# Patient Record
Sex: Female | Born: 1995 | Race: Black or African American | Hispanic: No | Marital: Single | Smoking: Never smoker
Health system: Southern US, Community
[De-identification: ages and names within clinical notes are randomized; demographics above are authoritative.]

## PROBLEM LIST (undated history)

## (undated) DIAGNOSIS — R011 Cardiac murmur, unspecified: Secondary | ICD-10-CM

## (undated) DIAGNOSIS — Q893 Situs inversus: Secondary | ICD-10-CM

## (undated) DIAGNOSIS — M419 Scoliosis, unspecified: Secondary | ICD-10-CM

## (undated) HISTORY — PX: SPINAL FUSION: SHX223

---
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
## 2015-10-06 ENCOUNTER — Encounter (HOSPITAL_COMMUNITY): Payer: Self-pay | Admitting: Emergency Medicine

## 2015-10-06 ENCOUNTER — Emergency Department (HOSPITAL_COMMUNITY): Payer: BLUE CROSS/BLUE SHIELD

## 2015-10-06 ENCOUNTER — Emergency Department (HOSPITAL_COMMUNITY)
Admission: EM | Admit: 2015-10-06 | Discharge: 2015-10-07 | Disposition: A | Discharge status: Home / Self Care | Payer: BLUE CROSS/BLUE SHIELD | Attending: Emergency Medicine | Admitting: Emergency Medicine

## 2015-10-06 DIAGNOSIS — Q893 Situs inversus: Secondary | ICD-10-CM | POA: Insufficient documentation

## 2015-10-06 DIAGNOSIS — R079 Chest pain, unspecified: Secondary | ICD-10-CM | POA: Diagnosis present

## 2015-10-06 DIAGNOSIS — Z8739 Personal history of other diseases of the musculoskeletal system and connective tissue: Secondary | ICD-10-CM | POA: Insufficient documentation

## 2015-10-06 DIAGNOSIS — R0602 Shortness of breath: Secondary | ICD-10-CM | POA: Diagnosis not present

## 2015-10-06 DIAGNOSIS — R0789 Other chest pain: Secondary | ICD-10-CM | POA: Insufficient documentation

## 2015-10-06 DIAGNOSIS — R011 Cardiac murmur, unspecified: Secondary | ICD-10-CM | POA: Diagnosis not present

## 2015-10-06 LAB — CBC
HCT: 37.4 % (ref 36.0–46.0)
HEMOGLOBIN: 12.3 g/dL (ref 12.0–15.0)
MCH: 28.4 pg (ref 26.0–34.0)
MCHC: 32.9 g/dL (ref 30.0–36.0)
MCV: 86.4 fL (ref 78.0–100.0)
Platelets: 194 10*3/uL (ref 150–400)
RBC: 4.33 MIL/uL (ref 3.87–5.11)
RDW: 14.2 % (ref 11.5–15.5)
WBC: 5.9 10*3/uL (ref 4.0–10.5)

## 2015-10-06 MED ORDER — KETOROLAC TROMETHAMINE 30 MG/ML IJ SOLN
30.0000 mg | Freq: Once | INTRAMUSCULAR | Status: AC
  Administered 2015-10-07: 30 mg via INTRAVENOUS
  Filled 2015-10-06: qty 1

## 2015-10-06 MED ORDER — HYDROCODONE-ACETAMINOPHEN 5-325 MG PO TABS
1.0000 | ORAL_TABLET | Freq: Once | ORAL | Status: AC
  Administered 2015-10-07: 1 via ORAL
  Filled 2015-10-06: qty 1

## 2015-10-06 NOTE — ED Notes (Signed)
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

## 2015-10-06 NOTE — ED Notes (Signed)
Patient transported to X-ray 

## 2015-10-06 NOTE — ED Provider Notes (Signed)
CSN: [id number]     Arrival date & time 10/06/15  2315 History  By signing my name below, I, Rohini Rajnarayanan, attest that this documentation has been prepared under the direction and in the presence of Shon Baton, MD Electronically Signed: Charlean Merl, ED Scribe 10/07/2015 at 12:12 AM.      Chief Complaint  Patient presents with  . Chest Pain  . Shortness of Breath   The history is provided by the patient. No language interpreter was used.  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 The pain began to worsen later in the day with associated arm pain and slight SOB. Pt admits that she had experienced similar sx once before, but they subsided and had not returned until today. She reports that she may have had a sinus infection recently. Pt admits that she uses estrogen. PT denies any blood clots, hx of PE/DVT, or long travel. Her LNMP began around 1/29.   Past Medical History  Diagnosis Date  . Heart murmur   . Situs inversus   . Scoliosis    Past Surgical History  Procedure Laterality Date  . Spinal fusion     No family history on file. Social History  Substance Use Topics  . Smoking status: Never Smoker   . Smokeless tobacco: Never Used  . Alcohol Use: Yes   OB History    No data available     Review of Systems  Constitutional: Negative for fever.  Respiratory: Positive for shortness of breath. Negative for cough.   Cardiovascular: Positive for chest pain.  Gastrointestinal: Negative for nausea, vomiting and abdominal pain.  All other systems reviewed and are negative.   Allergies  Bactrim  Home Medications   Prior to Admission medications   Medication Sig Start Date End Date Taking? Authorizing Provider  Fexofenadine HCl (MUCINEX ALLERGY PO) Take 30 mLs by mouth every 4 (four) hours as needed (congestion).   Yes  Historical Provider, MD  Levonorgestrel (SKYLA) 13.5 MG IUD 1 application by Intrauterine route once.   Yes Historical Provider, MD  phenylephrine (SUDAFED PE) 10 MG TABS tablet Take 10 mg by mouth every 4 (four) hours as needed (congestion).   Yes Historical Provider, MD  naproxen (NAPROSYN) 500 MG tablet Take 1 tablet (500 mg total) by mouth 2 (two) times daily. 10/07/15   Shon Baton, MD   BP 123/97 mmHg  Pulse 72  Temp(Src) 98.9 F (37.2 C) (Oral)  Resp 22  Ht  (1.676 m)  Wt 140 lb (63.504 kg)  BMI 22.61 kg/m2  SpO2 100%  LMP 09/29/2015 Physical Exam  Constitutional: She is oriented to person, place, and time. She appears well-developed and well-nourished. No distress.  HENT:  Head: Normocephalic and atraumatic.  Cardiovascular: Normal rate, regular rhythm and normal heart sounds.   Pulmonary/Chest: Effort normal. No respiratory distress. She has no wheezes. She exhibits tenderness.  Abdominal: Soft. Bowel sounds are normal. There is no tenderness. There is no rebound.  Musculoskeletal: She exhibits no edema.  Neurological: She is alert and oriented to person, place, and time.  Skin: Skin is warm and dry.  Psychiatric: She has a normal mood and affect.  Nursing note and vitals reviewed.   ED Course  Procedures  DIAGNOSTIC STUDIES: Oxygen Saturation is 100% on RA, normal by my interpretation.    COORDINATION OF CARE: 11:25  PM-Discussed treatment plan which includes bloodwork, DG chest, EKG, cardiac monitoring, and pain meds,  with pt at bedside and pt agreed to plan.   Labs Review Labs Reviewed  BASIC METABOLIC PANEL  CBC  D-DIMER, QUANTITATIVE (NOT AT [facility])  Rosezena Sensor, ED    Imaging Review Dg Chest 2 View  10/07/2015  CLINICAL DATA:  20 year old female with chest pain EXAM: CHEST  2 VIEW COMPARISON:  None. FINDINGS: Two views of the chest do not demonstrate a focal consolidation. There is no pleural effusion or pneumothorax. There is dextro cardia.  Thoracic spinal fixation Harrington rods noted. An air-fluid structure in the right upper abdomen may represent the stomach and indicate status inversus. IMPRESSION: No active cardiopulmonary disease. Electronically Signed   By: Elgie Collard M.D.   On: 10/07/2015 00:06   I have personally reviewed and evaluated these images and lab results as part of my medical decision-making.   EKG Interpretation   Date/Time:  Monday October 06 2015 23:27:45 EST Ventricular Rate:  79 PR Interval:  160 QRS Duration: 96 QT Interval:  384 QTC Calculation: 440 R Axis:   90 Text Interpretation:  Sinus rhythm Borderline right axis deviation  Confirmed by HORTON  MD, COURTNEY ([id number]) on 10/07/2015 12:30:02 AM      MDM   Final diagnoses:  Musculoskeletal chest pain   Patient presents with chest pain. Fairly acute in nature.  Nontoxic-appearing. Vital signs are reassuring. Chest pain is reproducible on exam. 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 Right-sided EKG obtained and reassuring. She is low risk for PE but is on birth control. D-dimer is negative. Troponin negative. Chest x-ray without evidence of pneumothorax or pneumonia. Given reproducibility of pain, suspect chest wall pain or musculoskeletal pain. Discussed with patient a trial of NSAIDs for 3-4 days. Follow-up if pain persists.  After history, exam, and medical workup I feel the patient has been appropriately medically screened and is safe for discharge home. Pertinent diagnoses were discussed with the patient. Patient was given return precautions.   I personally performed the services described in this documentation, which was scribed in my presence. The recorded information has been reviewed and is accurate.    Shon Baton, MD 10/07/15 [phone]

## 2015-10-07 LAB — BASIC METABOLIC PANEL
Anion gap: 11 (ref 5–15)
BUN: 19 mg/dL (ref 6–20)
CHLORIDE: 107 mmol/L (ref 101–111)
CO2: 22 mmol/L (ref 22–32)
CREATININE: 0.77 mg/dL (ref 0.44–1.00)
Calcium: 9.3 mg/dL (ref 8.9–10.3)
GFR calc Af Amer: >60 mL/min (ref >60)
GFR calc non Af Amer: >60 mL/min (ref >60)
GLUCOSE: 88 mg/dL (ref 65–99)
POTASSIUM: 3.7 mmol/L (ref 3.5–5.1)
Sodium: 140 mmol/L (ref 135–145)

## 2015-10-07 LAB — I-STAT TROPONIN, ED: Troponin i, poc: 0 ng/mL (ref 0.00–0.08)

## 2015-10-07 LAB — D-DIMER, QUANTITATIVE: D-Dimer, Quant: 0.47 ug/mL-FEU (ref 0.00–0.50)

## 2015-10-07 MED ORDER — NAPROXEN 500 MG PO TABS: 500.0000 mg | ORAL_TABLET | Freq: Two times a day (BID) | ORAL | Status: AC

## 2015-10-07 NOTE — ED Notes (Signed)
MD at bedside. 

## 2015-10-07 NOTE — Discharge Instructions (Signed)

## 2016-02-09 ENCOUNTER — Ambulatory Visit: Payer: BLUE CROSS/BLUE SHIELD

## 2016-02-10 ENCOUNTER — Encounter (HOSPITAL_COMMUNITY): Payer: Self-pay | Admitting: Nurse Practitioner

## 2016-02-10 ENCOUNTER — Emergency Department (HOSPITAL_COMMUNITY)
Admission: EM | Admit: 2016-02-10 | Discharge: 2016-02-10 | Disposition: A | Discharge status: Home / Self Care | Payer: BLUE CROSS/BLUE SHIELD | Attending: Emergency Medicine | Admitting: Emergency Medicine

## 2016-02-10 ENCOUNTER — Emergency Department (HOSPITAL_COMMUNITY): Payer: BLUE CROSS/BLUE SHIELD

## 2016-02-10 DIAGNOSIS — J029 Acute pharyngitis, unspecified: Secondary | ICD-10-CM | POA: Diagnosis not present

## 2016-02-10 DIAGNOSIS — K529 Noninfective gastroenteritis and colitis, unspecified: Secondary | ICD-10-CM | POA: Diagnosis not present

## 2016-02-10 DIAGNOSIS — R112 Nausea with vomiting, unspecified: Secondary | ICD-10-CM | POA: Diagnosis present

## 2016-02-10 LAB — I-STAT BETA HCG BLOOD, ED (MC, WL, AP ONLY): I-stat hCG, quantitative: 6.6 m[IU]/mL — ABNORMAL HIGH (ref <5)

## 2016-02-10 LAB — COMPREHENSIVE METABOLIC PANEL
ALK PHOS: 51 U/L (ref 38–126)
ALT: 16 U/L (ref 14–54)
AST: 25 U/L (ref 15–41)
Albumin: 3.9 g/dL (ref 3.5–5.0)
Anion gap: 5 (ref 5–15)
BILIRUBIN TOTAL: 0.6 mg/dL (ref 0.3–1.2)
BUN: 13 mg/dL (ref 6–20)
CO2: 24 mmol/L (ref 22–32)
Calcium: 9.3 mg/dL (ref 8.9–10.3)
Chloride: 107 mmol/L (ref 101–111)
Creatinine, Ser: 0.86 mg/dL (ref 0.44–1.00)
GFR calc Af Amer: >60 mL/min (ref >60)
GFR calc non Af Amer: >60 mL/min (ref >60)
GLUCOSE: 104 mg/dL — AB (ref 65–99)
Potassium: 4 mmol/L (ref 3.5–5.1)
Sodium: 136 mmol/L (ref 135–145)
TOTAL PROTEIN: 7.2 g/dL (ref 6.5–8.1)

## 2016-02-10 LAB — URINALYSIS, ROUTINE W REFLEX MICROSCOPIC
GLUCOSE, UA: NEGATIVE mg/dL
Ketones, ur: 40 mg/dL — AB
Leukocytes, UA: NEGATIVE
Nitrite: NEGATIVE
PH: 6 (ref 5.0–8.0)
Protein, ur: 30 mg/dL — AB
SPECIFIC GRAVITY, URINE: 1.039 — AB (ref 1.005–1.030)

## 2016-02-10 LAB — CBC
HEMATOCRIT: 36.1 % (ref 36.0–46.0)
HEMOGLOBIN: 11.8 g/dL — AB (ref 12.0–15.0)
MCH: 28.6 pg (ref 26.0–34.0)
MCHC: 32.7 g/dL (ref 30.0–36.0)
MCV: 87.6 fL (ref 78.0–100.0)
Platelets: 195 10*3/uL (ref 150–400)
RBC: 4.12 MIL/uL (ref 3.87–5.11)
RDW: 13.7 % (ref 11.5–15.5)
WBC: 14.8 10*3/uL — ABNORMAL HIGH (ref 4.0–10.5)

## 2016-02-10 LAB — URINE MICROSCOPIC-ADD ON: WBC, UA: NONE SEEN WBC/hpf (ref 0–5)

## 2016-02-10 MED ORDER — MORPHINE SULFATE (PF) 2 MG/ML IV SOLN
2.0000 mg | Freq: Once | INTRAVENOUS | Status: AC
  Administered 2016-02-10: 2 mg via INTRAVENOUS
  Filled 2016-02-10: qty 1

## 2016-02-10 MED ORDER — IOPAMIDOL (ISOVUE-300) INJECTION 61%
INTRAVENOUS | Status: AC
  Administered 2016-02-10: 75 mL
  Filled 2016-02-10: qty 75

## 2016-02-10 MED ORDER — ONDANSETRON HCL 4 MG PO TABS: 4.0000 mg | ORAL_TABLET | Freq: Four times a day (QID) | ORAL | Status: AC

## 2016-02-10 NOTE — ED Notes (Addendum)
Pt c/o n/v/d since this morning. She was started on amoxicillin yesterday for possible strep throat, culture has not returned yet but was treated based on clinical symptoms of sore, red throat and painful swallowing. She also reports fevers and body aches today. She is alert and breathing easily

## 2016-02-10 NOTE — ED Notes (Signed)
Patient transported to CT 

## 2016-02-10 NOTE — ED Provider Notes (Signed)
CSN: [id number]     Arrival date & time 02/10/16  1207 History   First MD Initiated Contact with Patient 02/10/16 1531     Chief Complaint  Patient presents with  . Emesis   (Consider location/radiation/quality/duration/timing/severity/associated sxs/prior Treatment) HPI 20 y.o. female hx Situs Inversus presents to the Emergency Department today complaining of N/V/D this AM. Noted being Rxed amoxicillin yesterday for possible strep throat. Culture pending. Notes fevers and body aches. Tmax 99.8. Had emesis x 3-4. Diarrhea x 1. Notes pain mostly in throat at 9/10. Notes throbbing/sharp pain with PO intake. Has tried salt water, ibuprofen with minimal relief. Has had hx of sore throats, but never like this. Does endorse anterior neck pain around tonsils with pain on ROM. No spinous process tenderness. No trismus. No CP/SOB. No headache. No visual changes. No other symptoms noted.   Past Medical History  Diagnosis Date  . Heart murmur   . Situs inversus   . Scoliosis    Past Surgical History  Procedure Laterality Date  . Spinal fusion     History reviewed. No pertinent family history. Social History  Substance Use Topics  . Smoking status: Never Smoker   . Smokeless tobacco: Never Used  . Alcohol Use: Yes   OB History    No data available     Review of Systems ROS reviewed and all are negative for acute change except as noted in the HPI.  Allergies  Bactrim  Home Medications   Prior to Admission medications   Medication Sig Start Date End Date Taking? Authorizing Provider  Fexofenadine HCl (MUCINEX ALLERGY PO) Take 30 mLs by mouth every 4 (four) hours as needed (congestion).    Historical Provider, MD  Levonorgestrel (SKYLA) 13.5 MG IUD 1 application by Intrauterine route once.    Historical Provider, MD  naproxen (NAPROSYN) 500 MG tablet Take 1 tablet (500 mg total) by mouth 2 (two) times daily. 10/07/15   Shon Baton, MD  phenylephrine (SUDAFED PE) 10 MG TABS tablet  Take 10 mg by mouth every 4 (four) hours as needed (congestion).    Historical Provider, MD   BP 109/82 mmHg  Pulse 79  Temp(Src) 97.9 F (36.6 C) (Oral)  Resp 17  SpO2 98%  LMP 02/09/2016   Physical Exam  Constitutional: She is oriented to person, place, and time. She appears well-developed and well-nourished.  HENT:  Head: Normocephalic and atraumatic.  Mouth/Throat: Uvula is midline. No trismus in the jaw. No uvula swelling. Oropharyngeal exudate and posterior oropharyngeal erythema present.  No uvular deviation. Noted tonsillitis    Eyes: EOM are normal. Pupils are equal, round, and reactive to light.  Neck: Trachea normal and phonation normal. Neck supple. Decreased range of motion present. No tracheal deviation present. No Brudzinski's sign and no Kernig's sign noted.  No nuchal rigidity   Cardiovascular: Normal rate, regular rhythm, normal heart sounds and intact distal pulses.   No murmur heard. Pulmonary/Chest: Effort normal and breath sounds normal. No respiratory distress. She has no wheezes. She has no rales. She exhibits no tenderness.  Abdominal: Soft. Normal appearance and bowel sounds are normal. There is no tenderness. There is no rigidity, no rebound, no guarding, no tenderness at McBurney's point and negative Murphy's sign.  Neurological: She is alert and oriented to person, place, and time.  Skin: Skin is warm and dry.  Psychiatric: She has a normal mood and affect. Her behavior is normal. Thought content normal.  Nursing note and vitals reviewed.  ED Course  Procedures (including critical care time) Labs Review Labs Reviewed  COMPREHENSIVE METABOLIC PANEL - Abnormal; Notable for the following:    Glucose, Bld 104 (*)    All other components within normal limits  CBC - Abnormal; Notable for the following:    WBC 14.8 (*)    Hemoglobin 11.8 (*)    All other components within normal limits  URINALYSIS, ROUTINE W REFLEX MICROSCOPIC (NOT AT [facility]) - Abnormal;  Notable for the following:    Color, Urine AMBER (*)    APPearance CLOUDY (*)    Specific Gravity, Urine 1.039 (*)    Hgb urine dipstick SMALL (*)    Bilirubin Urine SMALL (*)    Ketones, ur 40 (*)    Protein, ur 30 (*)    All other components within normal limits  URINE MICROSCOPIC-ADD ON - Abnormal; Notable for the following:    Squamous Epithelial / LPF 6-30 (*)    Bacteria, UA FEW (*)    All other components within normal limits  I-STAT BETA HCG BLOOD, ED ([facility], [facility], [facility] ONLY) - Abnormal; Notable for the following:    I-stat hCG, quantitative 6.6 (*)    All other components within normal limits   Imaging Review Ct Soft Tissue Neck W Contrast  02/10/2016  CLINICAL DATA:  Sore throat with nausea, vomiting, diarrhea, fever, and chills. EXAM: CT NECK WITH CONTRAST TECHNIQUE: Multidetector CT imaging of the neck was performed using the standard protocol following the bolus administration of intravenous contrast. CONTRAST:  75 mL Isovue-300 COMPARISON:  None. FINDINGS: Pharynx and larynx: There is asymmetric enlargement and heterogeneous enhancement and hypoattenuation involving the anterior left palatine tonsil. Less pronounced changes are present on the right. No organized tonsillar/ peritonsillar abscess is identified. No retropharyngeal fluid collection. Airway remains patent. Larynx is unremarkable. Salivary glands: Mildly enlarged lymph nodes crash that submandibular and parotid glands are unremarkable. Thyroid: Unremarkable. Lymph nodes: A mildly enlarged lymph nodes in the upper neck likely reactive, with bilateral lateral retropharyngeal lymph nodes measuring up to 8 mm in short axis and level II lymph nodes measuring up to 10 mm on the right and 11 mm on the left. Vascular: Major vascular structures of the neck appear patent. Limited intracranial: Unremarkable. Visualized orbits: Unremarkable. Mastoids and visualized paranasal sinuses: Clear. 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 Prior posterior thoracic spinal fusion partially visualized. 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 IMPRESSION: Acute tonsillitis.  No evidence of abscess. Electronically Signed   By: Sebastian Ache M.D.   On: 02/10/2016 18:35   I have personally reviewed and evaluated these images and lab results as part of my medical decision-making.   EKG Interpretation None      MDM  I have reviewed and evaluated the relevant laboratory values I have reviewed the relevant previous healthcare records.I obtained HPI from historian. Patient discussed with supervising physician  ED Course:  Assessment: Pt is a 20yF who presents with sore throat, N/V/D x2 days. On exam, pt in NAD. Nontoxic/nonseptic appearing. VSS. Temp 99.5. Lungs CTA. Heart RRR. Abdomen nontender soft. Tonsillar exudate noted. Erythema. Decrease ROM of neck due to anterior neck pain from tonsils. No spinous process tenderness. Begin treated for suspected strep with Amoxicillin by UC. Concern for Abscess. CT Neck unremarkable. No abscess. Labs unremarkable. Mild leukocytosis. iStat hCG quant showed 6.6. Likely lab error. Notified patient to follow up with PCP for repeat if necessary. Plan is to  DC home with continued ABX use from UC and follow up to PCP. At time of discharge, Patient is in no acute distress. Vital Signs are stable. Patient is able to ambulate. Patient able to tolerate PO.    Disposition/Plan:  DC Home Additional Verbal discharge instructions given and discussed with patient.  Pt Instructed to f/u with PCP in the next week for evaluation and treatment of symptoms. Return precautions given Pt acknowledges and agrees with plan  Supervising Physician Benjiman CoreNathan Pickering, MD   Final diagnoses:  Gastroenteritis  Pharyngitis     Audry Piliyler Shaylan Tutton, PA-C 02/10/16 1908  Benjiman CoreNathan Pickering, MD 02/10/16 [phone]

## 2016-02-10 NOTE — Discharge Instructions (Signed)
Please read and follow all provided instructions.  Your diagnoses today include:  1. Pharyngitis   2. Gastroenteritis    Tests performed today include:  Vital signs. See below for your results today.   Medications prescribed:   Take as prescribed   Home care instructions:  Follow any educational materials contained in this packet.  Follow-up instructions: Please follow-up with your primary care provider for further evaluation of symptoms and treatment   Return instructions:   Please return to the Emergency Department if you do not get better, if you get worse, or new symptoms OR  - Fever (temperature greater than 101.3F)  - Bleeding that does not stop with holding pressure to the area    -Severe pain (please note that you may be more sore the day after your accident)  - Chest Pain  - Difficulty breathing  - Severe nausea or vomiting  - Inability to tolerate food and liquids  - Passing out  - Skin becoming red around your wounds  - Change in mental status (confusion or lethargy)  - New numbness or weakness     Please return if you have any other emergent concerns.  Additional Information:  Your vital signs today were: BP 109/62 mmHg   Pulse 89   Temp(Src) 99.5 F (37.5 C) (Oral)   Resp 14   SpO2 100%   LMP 02/09/2016 If your blood pressure (BP) was elevated above 135/85 this visit, please have this repeated by your doctor within one month. ---------------

## 2016-06-05 ENCOUNTER — Emergency Department (HOSPITAL_BASED_OUTPATIENT_CLINIC_OR_DEPARTMENT_OTHER)
Admission: EM | Admit: 2016-06-05 | Discharge: 2016-06-05 | Disposition: A | Discharge status: Home / Self Care | Payer: BLUE CROSS/BLUE SHIELD | Attending: Emergency Medicine | Admitting: Emergency Medicine

## 2016-06-05 ENCOUNTER — Emergency Department (HOSPITAL_BASED_OUTPATIENT_CLINIC_OR_DEPARTMENT_OTHER): Payer: BLUE CROSS/BLUE SHIELD

## 2016-06-05 ENCOUNTER — Encounter (HOSPITAL_BASED_OUTPATIENT_CLINIC_OR_DEPARTMENT_OTHER): Payer: Self-pay

## 2016-06-05 DIAGNOSIS — Y939 Activity, unspecified: Secondary | ICD-10-CM | POA: Diagnosis not present

## 2016-06-05 DIAGNOSIS — Y929 Unspecified place or not applicable: Secondary | ICD-10-CM | POA: Insufficient documentation

## 2016-06-05 DIAGNOSIS — S9032XA Contusion of left foot, initial encounter: Secondary | ICD-10-CM

## 2016-06-05 DIAGNOSIS — Y999 Unspecified external cause status: Secondary | ICD-10-CM | POA: Insufficient documentation

## 2016-06-05 DIAGNOSIS — W268XXA Contact with other sharp object(s), not elsewhere classified, initial encounter: Secondary | ICD-10-CM | POA: Insufficient documentation

## 2016-06-05 DIAGNOSIS — S81812A Laceration without foreign body, left lower leg, initial encounter: Secondary | ICD-10-CM

## 2016-06-05 DIAGNOSIS — S91112A Laceration without foreign body of left great toe without damage to nail, initial encounter: Secondary | ICD-10-CM | POA: Insufficient documentation

## 2016-06-05 MED ORDER — HYDROCODONE-ACETAMINOPHEN 5-325 MG PO TABS
1.0000 | ORAL_TABLET | Freq: Once | ORAL | Status: AC
  Administered 2016-06-05: 1 via ORAL
  Filled 2016-06-05: qty 1

## 2016-06-05 MED ORDER — BACITRACIN-NEOMYCIN-POLYMYXIN 400-5-5000 EX OINT: 1.0000 "application " | TOPICAL_OINTMENT | Freq: Two times a day (BID) | CUTANEOUS | 0 refills | Status: AC

## 2016-06-05 MED ORDER — LIDOCAINE-EPINEPHRINE-TETRACAINE (LET) SOLUTION
3.0000 mL | Freq: Once | NASAL | Status: AC
  Administered 2016-06-05: 3 mL via TOPICAL
  Filled 2016-06-05: qty 3

## 2016-06-05 NOTE — Discharge Instructions (Signed)
Your top later of the skin, called epidermis, has peeled off in your toe. That skin is likely non viable and will be replaced by new skin.  Until then you need to keep the area clean, dry and non infected. We discussed debridement of the skin vs. Leaving the skin as is for now, and removing it when you start healing. You have opted for the latter. So keep the area clean and dry, apply bacitracin ointment daily and take the medications provided. RETURN TO THE ER IF THERE IS INCREASED PAIN, REDNESS, PUS COMING OUT from the wound site.

## 2016-06-05 NOTE — ED Triage Notes (Signed)
Pt cut the pad of her big left toe on the cement.  Avulsion to the pad approximately the size of a dime.  Pt is very upset in triage.

## 2016-06-05 NOTE — ED Provider Notes (Signed)
[facility]-EM[facility]ENCY DEPT Provider Note   CSN: [id number] Arrival date & time: 06/05/16  0329     History   Chief Complaint Chief Complaint  Patient presents with  . Laceration    HPI Wanda Cole is a 20 y.o. female.  HPI  Pt comes in with cc of toe pain and foot pain. PT hit her foot to the side of the curb and had immediate pain, with bleeding. She has no ankle pain. Her pain is in the toes and metatarsal region distally.  Past Medical History:  Diagnosis Date  . Heart murmur   . Scoliosis   . Situs inversus     There are no active problems to display for this patient.   Past Surgical History:  Procedure Laterality Date  . SPINAL FUSION      OB History    No data available       Home Medications    Prior to Admission medications   Medication Sig Start Date End Date Taking? Authorizing Provider  Fexofenadine HCl (MUCINEX ALLERGY PO) Take 30 mLs by mouth every 4 (four) hours as needed (congestion).    Historical Provider, MD  ibuprofen (ADVIL,MOTRIN) 400 MG tablet Take 400 mg by mouth every 6 (six) hours as needed.    Historical Provider, MD  Levonorgestrel (SKYLA) 13.5 MG IUD 1 application by Intrauterine route once.    Historical Provider, MD  naproxen (NAPROSYN) 500 MG tablet Take 1 tablet (500 mg total) by mouth 2 (two) times daily. 10/07/15   Shon Batonourtney F Horton, MD  neomycin-bacitracin-polymyxin (NEOSPORIN) ointment Apply 1 application topically every 12 (twelve) hours. apply to eye 06/05/16   Derwood KaplanAnkit Bettyjane Shenoy, MD  ondansetron (ZOFRAN) 4 MG tablet Take 1 tablet (4 mg total) by mouth every 6 (six) hours. 02/10/16   Audry Piliyler Mohr, PA-C  phenylephrine (SUDAFED PE) 10 MG TABS tablet Take 10 mg by mouth every 4 (four) hours as needed (congestion).    Historical Provider, MD    Family History No family history on file.  Social History Social History  Substance Use Topics  . Smoking status: Never Smoker  . Smokeless tobacco: Never Used  . Alcohol use Yes      Allergies   Oxycodone hcl; Bactrim [sulfamethoxazole-trimethoprim]; and Trimethoprim   Review of Systems Review of Systems  Constitutional: Positive for activity change.  Skin: Positive for wound.  Allergic/Immunologic: Negative for immunocompromised state.  Hematological: Does not bruise/bleed easily.     Physical Exam Updated Vital Signs BP 96/73 (BP Location: Right Arm)   Pulse 97   Temp 98 F (36.7 C) (Oral)   Resp 18   Ht 5\' 5"  (1.651 m)   Wt 142 lb (64.4 kg)   LMP 05/31/2016   SpO2 100%   BMI 23.63 kg/m   Physical Exam  Constitutional: She is oriented to person, place, and time. She appears well-developed.  HENT:  Head: Normocephalic and atraumatic.  Eyes: EOM are normal.  Neck: Normal range of motion. Neck supple.  Cardiovascular: Normal rate.   Pulmonary/Chest: Effort normal.  Abdominal: Bowel sounds are normal.  Musculoskeletal:  Toes, ankle rom is intact.  Neurological: She is alert and oriented to person, place, and time.  No numbness to the toe  Skin: Skin is warm and dry.  Pt has a flap type injury at the plantar side of the toe, where the epidermal layer has separated from the dermis.   Nursing note and vitals reviewed.    ED Treatments / Results  Labs (all  labs ordered are listed, but only abnormal results are displayed) Labs Reviewed - No data to display  EKG  EKG Interpretation None       Radiology No results found.  Procedures Procedures (including critical care time)  Medications Ordered in ED Medications  HYDROcodone-acetaminophen (NORCO/VICODIN) 5-325 MG per tablet 1 tablet (1 tablet Oral Given 06/05/16 0409)  lidocaine-EPINEPHrine-tetracaine (LET) solution (3 mLs Topical Given 06/05/16 0510)     Initial Impression / Assessment and Plan / ED Course  I have reviewed the triage vital signs and the nursing notes.  Pertinent labs & imaging results that were available during my care of the patient were reviewed by me and  considered in my medical decision making (see chart for details).  Clinical Course  Comment By Time  Wound seen closely. The epidermis has peeled off. Pt given the option for EDP removing the epithelium flap VS. Keeping the tissue, that she can remove as the wound heals - essentially providing barrier as the new layer forms underneath. Pt preferred the latter. Wouind care precautions discussed. Strict ER return precautions have been discussed, and patient is agreeing with the plan and is comfortable with the workup done and the recommendations from the ER.  Derwood Kaplan, MD 10/07 0600    Pt with toe and foot injury. She certainly has contusion type sx and also abrasion to the toe. We will irrigate the wound. Suture vs. debrid the skin. Infection control for d/c.  Final Clinical Impressions(s) / ED Diagnoses   Final diagnoses:  Contusion of left foot, initial encounter  Skin tear of left lower leg without complication, initial encounter    New Prescriptions Discharge Medication List as of 06/05/2016  6:21 AM    START taking these medications   Details  neomycin-bacitracin-polymyxin (NEOSPORIN) ointment Apply 1 application topically every 12 (twelve) hours. apply to eye, Starting Sat 06/05/2016, Print         Derwood Kaplan, MD 06/09/16 [phone]

## 2016-06-05 NOTE — ED Notes (Signed)
Pt verbalizes understanding of d/c instructions and denies any further needs at this time. 

## 2017-12-19 IMAGING — CT CT NECK W/ CM
4 series · 15 of 33 positions shown, 18 images · IV contrast (Iodine)
Comparison: None.

CLINICAL DATA: Sore throat with nausea, vomiting, diarrhea, fever,
and chills.

EXAM:
CT NECK WITH CONTRAST
TECHNIQUE: Multidetector CT imaging of the neck was performed using the
standard protocol following the bolus administration of intravenous
contrast.
CONTRAST:  75 mL [id number]

[Series 201: o-mar, soft tissue, idose (2) · axial · 0.51mm/px · z∈[+744,+892]mm · 5 of 112 slices shown, 7 images]
[im 19/112  soft-tissue]
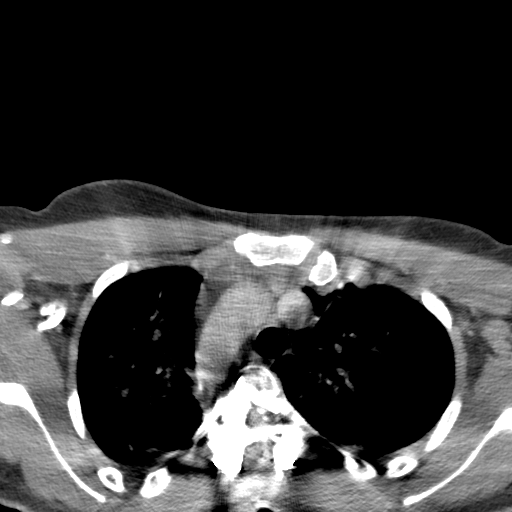
[im 19/112  bone]
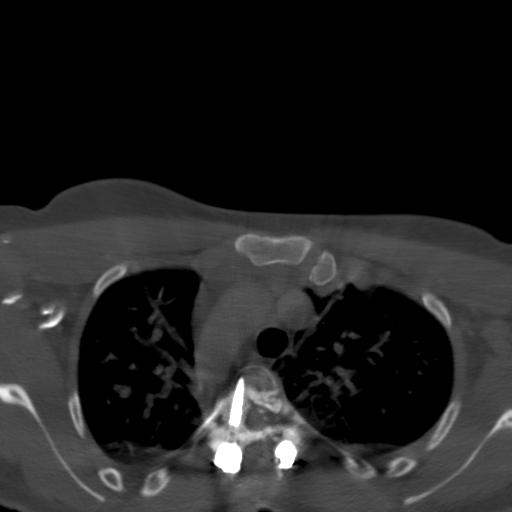
[im 38/112  bone]
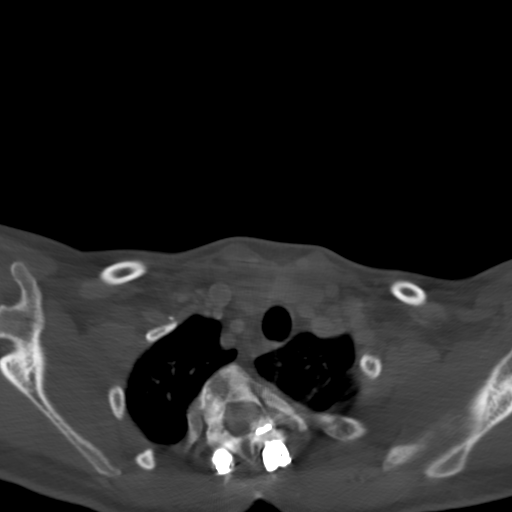
[im 56/112  bone]
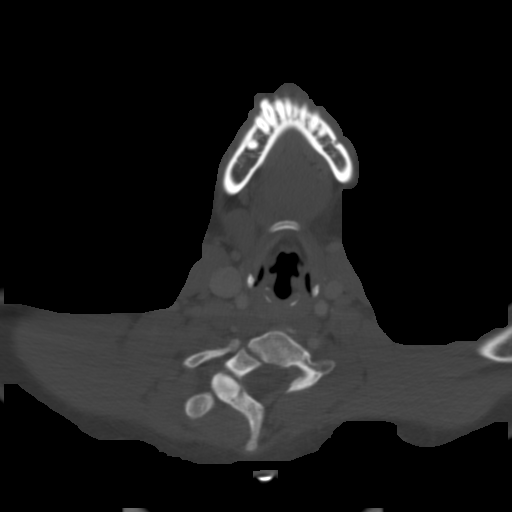
[im 75/112  bone]
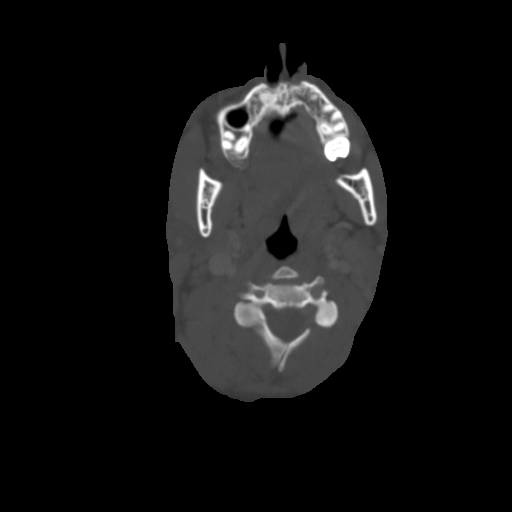
[im 93/112  soft-tissue]
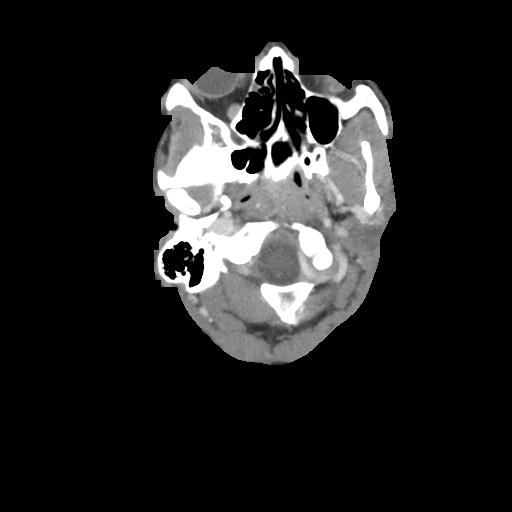
[im 93/112  bone]
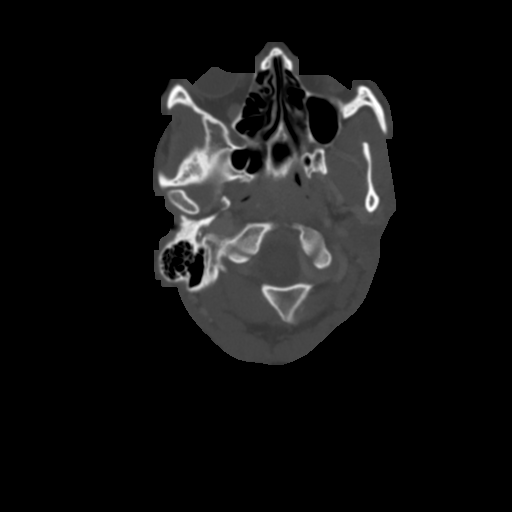

[Series 202: soft tissue, idose (2) · axial · 0.51mm/px · z∈[+744,+782]mm · 2 of 112 slices shown]
[im 19/112  soft-tissue]
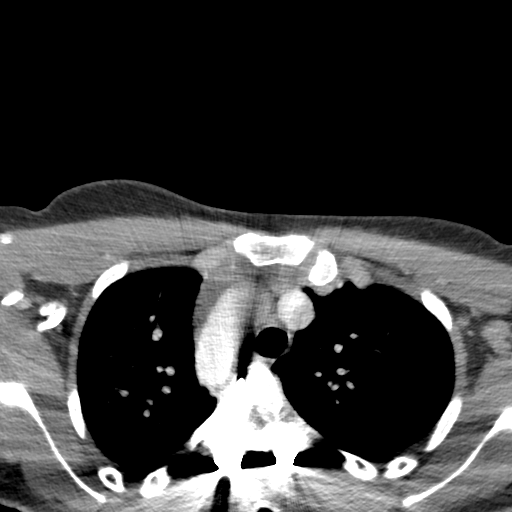
[im 38/112  soft-tissue]
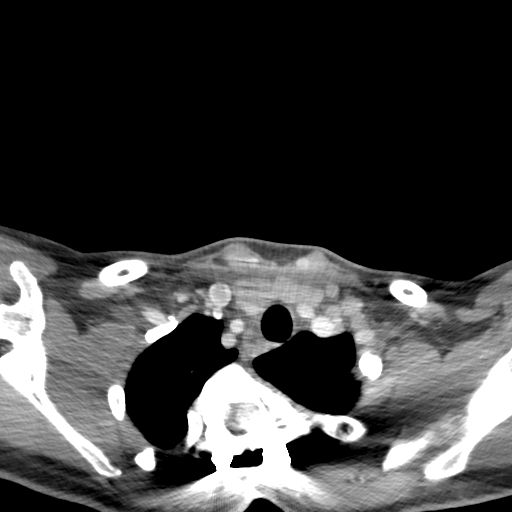

[Series 206: coronal, idose (2) · coronal · 0.45mm/px · 3 of 126 slices shown]
[im 26/126  bone]
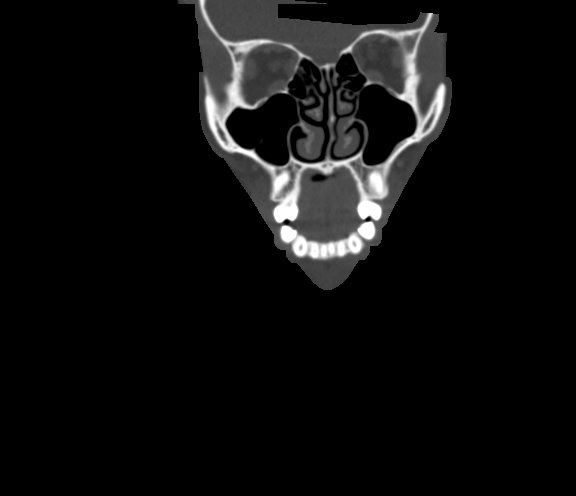
[im 51/126  bone]
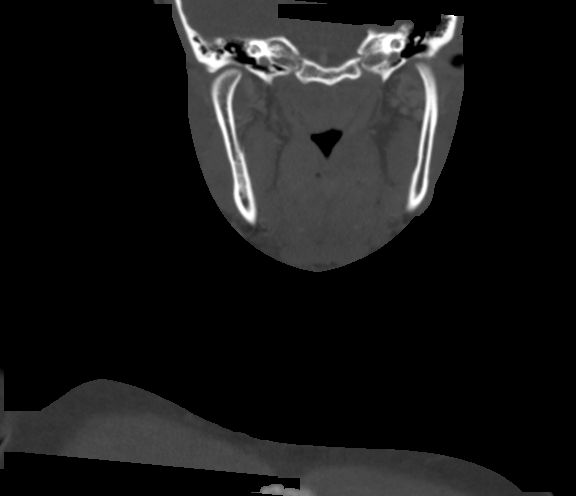
[im 76/126  bone]
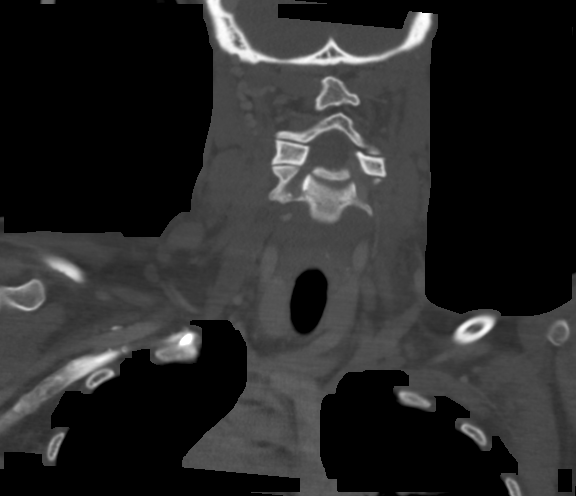

[Series 207: sagittal, idose (2) · sagittal · 0.45mm/px · 5 of 128 slices shown, 6 images]
[im 43/128  bone]
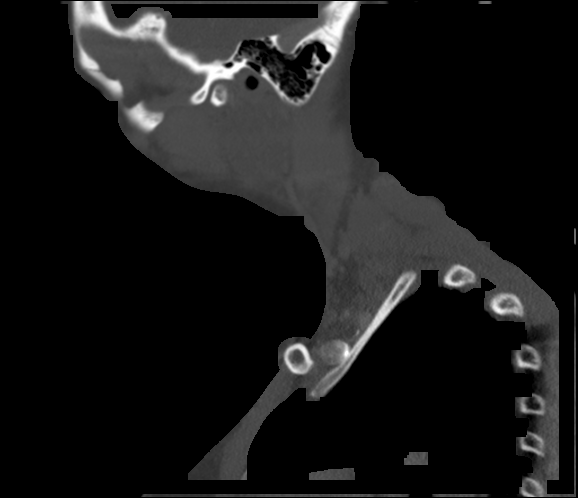
[im 53/128  bone]
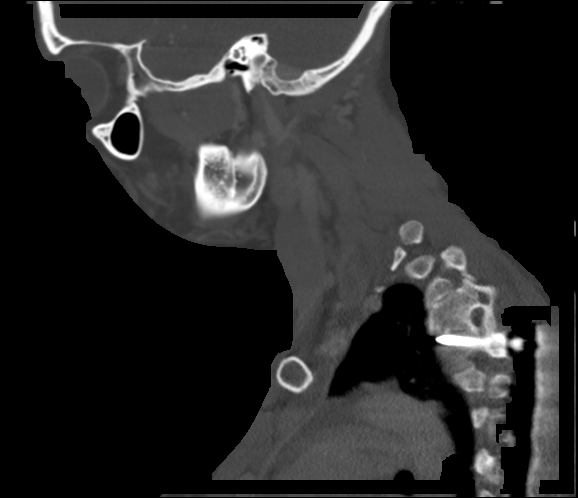
[im 64/128  soft-tissue]
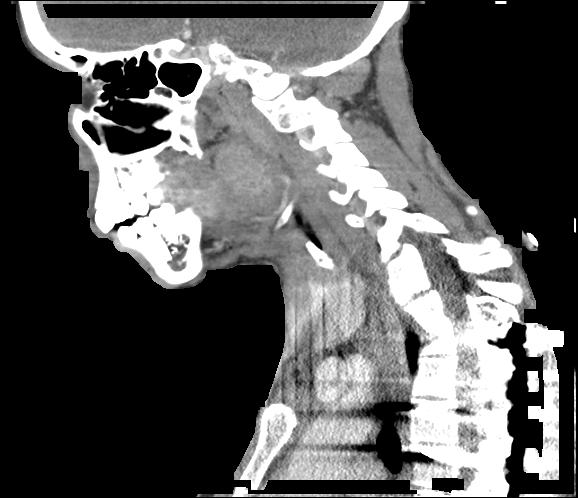
[im 64/128  bone]
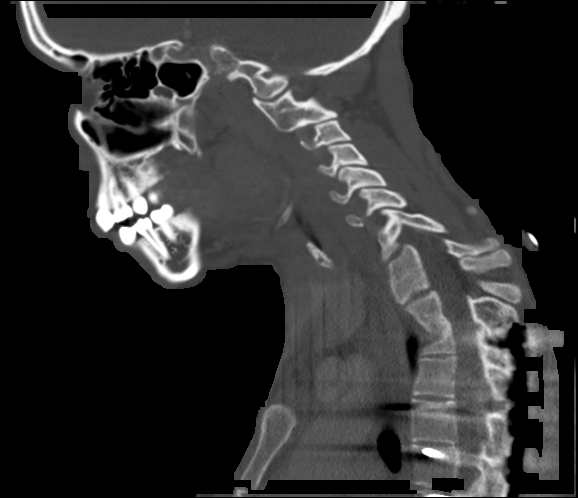
[im 75/128  bone]
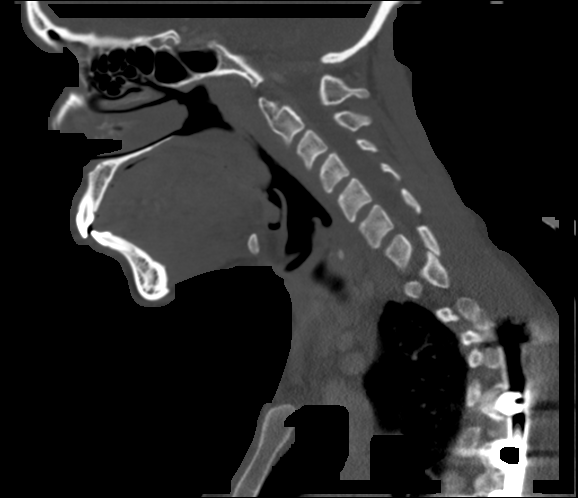
[im 85/128  bone]
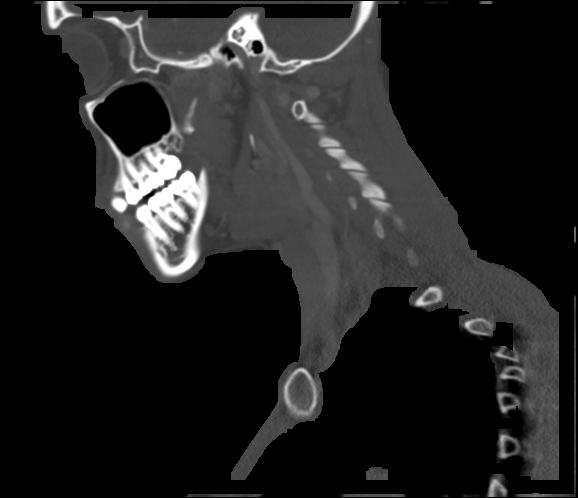

[15 of 33 positions shown; findings below may reference images not displayed]

FINDINGS: Pharynx and larynx: There is asymmetric enlargement and
heterogeneous enhancement and hypoattenuation involving the anterior
left palatine tonsil. Less pronounced changes are present on the
right. No organized tonsillar/ peritonsillar abscess is identified.
No retropharyngeal fluid collection. Airway remains patent. Larynx
is unremarkable.

Salivary glands: Mildly enlarged lymph nodes crash that
submandibular and parotid glands are unremarkable.

Thyroid: Unremarkable.

Lymph nodes: A mildly enlarged lymph nodes in the upper neck likely
reactive, with bilateral lateral retropharyngeal lymph nodes
measuring up to 8 mm in short axis and level II lymph nodes
measuring up to 10 mm on the right and 11 mm on the left.

Vascular: Major vascular structures of the neck appear patent.

Limited intracranial: Unremarkable.

Visualized orbits: Unremarkable.

Mastoids and visualized paranasal sinuses: Clear.

Skeleton: Multiple segmentation anomalies in the cervical and
thoracic spine. Prior posterior thoracic spinal fusion partially
visualized.

Upper chest: Situs inversus with partially visualized dextrocardia
and right-sided aortic arch.
IMPRESSION: Acute tonsillitis.  No evidence of abscess.
